# Patient Record
Sex: Female | Born: 1995 | Hispanic: No | Marital: Single | Smoking: Never smoker
Health system: Southern US, Community
[De-identification: ages and names within clinical notes are randomized; demographics above are authoritative.]

---
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
## 2014-10-14 ENCOUNTER — Ambulatory Visit (INDEPENDENT_AMBULATORY_CARE_PROVIDER_SITE_OTHER): Payer: Medicaid Other | Admitting: Family Medicine

## 2014-10-14 ENCOUNTER — Encounter: Payer: Self-pay | Admitting: Family Medicine

## 2014-10-14 VITALS — BP 104/73 | HR 85 | Temp 99.2°F | Ht 62.0 in | Wt 109.6 lb

## 2014-10-14 DIAGNOSIS — Z0000 Encounter for general adult medical examination without abnormal findings: Secondary | ICD-10-CM

## 2014-10-14 DIAGNOSIS — H547 Unspecified visual loss: Secondary | ICD-10-CM | POA: Diagnosis not present

## 2014-10-14 NOTE — Assessment & Plan Note (Addendum)
Vision 20/30 bilaterally. 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 Will refer to optometry.

## 2014-10-14 NOTE — Patient Instructions (Signed)
Thank you for coming to see me today. It was a pleasure. Today we talked about:   Vision: I am referring you to the optometrist for evaluation for glasses  Please make an appointment to see me in 1 year for physical. You will need a pap smear at that time.  If you have any questions or concerns, please do not hesitate to call the office at [phone].  Sincerely,  Jacquelin Hawking, MD

## 2014-10-14 NOTE — Progress Notes (Signed)
  Patient ID: Marcia Jones, female   DOB: [birth year], 19 y.o.   MRN: [id number]     Subjective    Marcia Jones is a 19 y.o. female that presents for yearly physical.   Concerns:   Vision: cannot see the board when she sits far away. She has not had any glasses in the past.  14/4 days/moderate flow LMP: 10/13/2014  No past medical history on file.  Past Surgical History  Procedure Laterality Date  . Wisdom tooth removal Bilateral March 2014    No current outpatient prescriptions on file prior to visit.   No current facility-administered medications on file prior to visit.    No Known Allergies  Social History   Social History  . Marital Status: Single    Spouse Name: N/A  . Number of Children: N/A  . Years of Education: N/A   Social History Main Topics  . Smoking status: Never Smoker   . Smokeless tobacco: Not on file  . Alcohol Use: Not on file  . Drug Use: Not on file  . Sexual Activity: Not on file   Other Topics Concern  . Not on file   Social History Narrative  . No narrative on file    No family history on file.  ROS  Per HPI   Objective   BP 104/73 mmHg  Pulse 85  Temp(Src) 99.2 F (37.3 C) (Oral)  Ht  (1.575 m)  Wt 109 lb 9.6 oz (49.714 kg)  BMI 20.04 kg/m2  LMP 10/13/2014  General: Well appearing, no distress HEENT: TMs normal, nares patent, multiple crowns with no caries, oropharynx clear. No adenopathy. No thyromegaly Respiratory/Chest: Clear to auscultation bilaterally Cardiovascular: Regular rate and rhythm, no mumurs Gastrointestinal: Soft, non-tender, non-distended Genitourinary: Not examined    Musculoskeletal: Normal bulk and tone Neuro: Alert, oriented, reflexes equal bilaterally Dermatologic: No obvious lesions Psychiatric: Full affect  No orders of the defined types were placed in this encounter.    Assessment and Plan    Healthcare maintenance - Blood pressure within goal - No risk factors for HTN, diabetes,  heart disease, stroke - follow-up in one year

## 2015-02-17 ENCOUNTER — Ambulatory Visit: Payer: Medicaid Other

## 2015-10-27 ENCOUNTER — Encounter: Payer: Medicaid Other | Admitting: Family Medicine

## 2015-11-03 ENCOUNTER — Ambulatory Visit (INDEPENDENT_AMBULATORY_CARE_PROVIDER_SITE_OTHER): Payer: Medicaid Other | Admitting: Family Medicine

## 2015-11-03 ENCOUNTER — Encounter: Payer: Self-pay | Admitting: Family Medicine

## 2015-11-03 VITALS — BP 111/62 | HR 97 | Temp 98.1°F | Ht 62.0 in | Wt 112.6 lb

## 2015-11-03 DIAGNOSIS — Z0000 Encounter for general adult medical examination without abnormal findings: Secondary | ICD-10-CM

## 2015-11-03 NOTE — Patient Instructions (Addendum)
Today you were seen for a routine physical exam.   You have no medical problems and you are in great shape.  I have no concerns for you at the moment!   You were due for a PAP smear screening today, but we decided it would be a better option to schedule you for our gynecology clinic to have this done in the future.  We also did a screening for HIV that we do for everyone your age.   It was a pleasure meeting you!  Wyley Hack L. Myrtie SomanWarden, MD [facility] Resident PGY-1 11/03/2015 3:18 PM

## 2015-11-03 NOTE — Assessment & Plan Note (Signed)
Here for routine physical exam.  Healthy, no medical problems, no concerns.  Up to date on immunizations.  Patient is not sexually active.  Was due for pap-smear today, but was very anxious.  Stated that she would prefer if a female performed exam. Decided to refer her to colpo clinic.  HIV checked today. - f/u next year for check up - f/u colpo clinic for pap

## 2015-11-03 NOTE — Progress Notes (Signed)
    Subjective:  Marcia Jones is a 20 y.o. female who [facility] for a routine physical exam and pap-smear.   HPI: Healthy 20yo F here for routine physical exam.  She has no medical problems.  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  No concerns at the moment.     PMH: None SH: lives at home with parents and two brothers.  Denies drugs, alcohol or tobacco. Moved here from [place] with family in 2008.    Objective:  Physical Exam: BP 111/62   Pulse 97   Temp 98.1 F (36.7 C) (Oral)   Ht 5\' 2"  (1.575 m)   Wt 112 lb 9.6 oz (51.1 kg)   LMP 10/12/2015 (Approximate)   BMI 20.59 kg/m   Gen: 20yo in NAD, resting comfortably CV: RRR with no murmurs appreciated Pulm: NWOB, CTAB with no crackles, wheezes, or rhonchi GI: Normal bowel sounds present. Soft, Nontender, Nondistended. MSK: no edema, cyanosis, or clubbing noted Skin: warm, dry Neuro: grossly normal, moves all extremities Psych: Normal affect and thought content  No results found for this or any previous visit (from the past 72 hour(s)).   Assessment/Plan:  Healthcare maintenance Here for routine physical exam.  Healthy, no medical problems, no concerns.  Up to date on immunizations.  Patient is not sexually active.  Was due for pap-smear today, but was very anxious.  Stated that she would prefer if a female performed exam. Decided to refer her to colpo clinic.  HIV checked today. - f/u next year for check up - f/u colpo clinic for pap

## 2015-11-04 LAB — HIV ANTIBODY (ROUTINE TESTING W REFLEX): HIV 1&2 Ab, 4th Generation: NONREACTIVE

## 2015-12-10 ENCOUNTER — Ambulatory Visit (INDEPENDENT_AMBULATORY_CARE_PROVIDER_SITE_OTHER): Payer: Medicaid Other | Admitting: Family Medicine

## 2015-12-10 ENCOUNTER — Encounter: Payer: Self-pay | Admitting: Family Medicine

## 2015-12-10 VITALS — BP 116/74 | HR 116 | Wt 113.4 lb

## 2015-12-10 DIAGNOSIS — R000 Tachycardia, unspecified: Secondary | ICD-10-CM

## 2015-12-10 DIAGNOSIS — Z01419 Encounter for gynecological examination (general) (routine) without abnormal findings: Secondary | ICD-10-CM

## 2015-12-10 NOTE — Progress Notes (Signed)
Here for Gyn exam counseling. She denies ever being sexually active ( [place]). Denies family hx of cancer.  PAP procedure discussed with patient including risk. Patient has low risk for cervical cancer given hx of a virgin and non-smoker and no family hx of cancer. Patient advised she can proceed with the procedure if she choose to. She declined at this time. F/U as needed.  Flu shot offered. She has family planning medicaid. I referred her to health department or pharmacy for her shot. She agreed with plan.  HR a bit elevated. She appears anxious. She denies chest pain. F/U with PCP soon for reassessment.  Return precaution discussed.  Vitals:   12/10/15 1119  BP: 116/74  Pulse: (!) 116  Weight: 113 lb 6.4 oz (51.4 kg)     No current outpatient prescriptions on file prior to visit.   No current facility-administered medications on file prior to visit.    History reviewed. No pertinent past medical history.

## 2015-12-10 NOTE — Patient Instructions (Signed)
Pap Test WHY AM I HAVING THIS TEST? A pap test is sometimes called a pap smear. It is a screening test that is used to check for signs of cancer of the vagina, cervix, and uterus. The test can also identify the presence of infection or precancerous changes. Your health care provider will likely recommend you have this test done on a regular basis. This test may be done:  Every 3 years, starting at age 21.  Every 5 years, in combination with testing for the presence of human papillomavirus (HPV).  More or less often depending on other medical conditions.  WHAT KIND OF SAMPLE IS TAKEN? Using a small cotton swab, plastic spatula, or brush, your health care provider will collect a sample of cells from the surface of your cervix. Your cervix is the opening to your uterus, also called a womb. Secretions from the cervix and vagina may also be collected. HOW DO I PREPARE FOR THE TEST?  Be aware of where you are in your menstrual cycle. You may be asked to reschedule the test if you are menstruating on the day of the test.  You may need to reschedule if you have a known vaginal infection on the day of the test.  You may be asked to avoid douching or taking a bath the day before or the day of the test.  Some medicines can cause abnormal test results, such as digitalis and tetracycline. Talk with your health care provider before your test if you take one of these medicines. WHAT DO THE RESULTS MEAN? Abnormal test results may indicate a number of health conditions. These may include:  Cancer. Although pap test results cannot be used to diagnose cancer of the cervix, vagina, or uterus, they may suggest the possibility of cancer. Further tests would be required to determine if cancer is present.  Sexually transmitted disease.  Fungal infection.  Parasite infection.  Herpes infection.  A condition causing or contributing to infertility. It is your responsibility to obtain your test results. Ask  the lab or department performing the test when and how you will get your results. Contact your health care provider to discuss any questions you have about your results.   This information is not intended to replace advice given to you by your health care provider. Make sure you discuss any questions you have with your health care provider.   Document Released: 04/30/2002 Document Revised: 02/28/2014 Document Reviewed: 07/01/2013 Elsevier Interactive Patient Education 2016 Elsevier Inc.  

## 2020-06-03 ENCOUNTER — Ambulatory Visit (HOSPITAL_COMMUNITY)
Admission: EM | Admit: 2020-06-03 | Discharge: 2020-06-03 | Disposition: A | Discharge status: Home / Self Care | Payer: Self-pay | Attending: Physician Assistant | Admitting: Physician Assistant

## 2020-06-03 ENCOUNTER — Ambulatory Visit (HOSPITAL_COMMUNITY): Payer: Self-pay

## 2020-06-03 ENCOUNTER — Ambulatory Visit (INDEPENDENT_AMBULATORY_CARE_PROVIDER_SITE_OTHER): Payer: Self-pay

## 2020-06-03 ENCOUNTER — Other Ambulatory Visit: Payer: Self-pay

## 2020-06-03 ENCOUNTER — Encounter (HOSPITAL_COMMUNITY): Payer: Self-pay

## 2020-06-03 DIAGNOSIS — M546 Pain in thoracic spine: Secondary | ICD-10-CM

## 2020-06-03 DIAGNOSIS — M545 Low back pain, unspecified: Secondary | ICD-10-CM

## 2020-06-03 DIAGNOSIS — M542 Cervicalgia: Secondary | ICD-10-CM

## 2020-06-03 MED ORDER — NAPROXEN 500 MG PO TABS: 500.0000 mg | ORAL_TABLET | Freq: Two times a day (BID) | ORAL | 0 refills | Status: AC

## 2020-06-03 MED ORDER — TIZANIDINE HCL 4 MG PO CAPS: 4.0000 mg | ORAL_CAPSULE | Freq: Three times a day (TID) | ORAL | 0 refills | Status: AC
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
## 2020-06-03 NOTE — Discharge Instructions (Addendum)
Do not drive or drink alcohol with Zanaflex as this medication is sedating.  Take Naprosyn 500 mg as needed for pain and inflammation.  You should not take additional NSAIDs including aspirin, ibuprofen/Advil, naproxen/Aleve with this medication due to risk of GI bleeding.  Please use heat and stretch for additional symptom relief.  Follow-up with PCP to consider physical therapy referral if symptoms do not improve with medication.
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
